# Patient Record
Sex: Female | Born: 2000 | Race: Black or African American | Hispanic: No | Marital: Single | Smoking: Never smoker
Health system: Southern US, Community
[De-identification: ages and names within clinical notes are randomized; demographics above are authoritative.]

## PROBLEM LIST (undated history)

## (undated) DIAGNOSIS — J45909 Unspecified asthma, uncomplicated: Secondary | ICD-10-CM

---
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
## 2022-06-13 ENCOUNTER — Ambulatory Visit
Admission: RE | Admit: 2022-06-13 | Discharge: 2022-06-13 | Disposition: A | Discharge status: Home / Self Care | Payer: Medicaid Other | Source: Ambulatory Visit | Attending: Pediatrics | Admitting: Pediatrics

## 2022-06-13 VITALS — BP 98/65 | HR 100 | Temp 100.5°F | Resp 16

## 2022-06-13 DIAGNOSIS — U071 COVID-19: Secondary | ICD-10-CM | POA: Diagnosis not present

## 2022-06-13 NOTE — Discharge Instructions (Signed)
Your positive home COVID test is diagnostic for COVID-19.  Please isolate yourself from others for the next 3 days.  If you are feeling better after 3 days, please continue wearing a mask around other people for the next 5 days.  I have provided you with a note to return to work.

## 2022-06-13 NOTE — ED Provider Notes (Signed)
[facility]    CSN: [id number] Arrival date & time: 06/13/22  1548    HISTORY   Chief Complaint  Patient presents with   Headache    Positive at home covid test - Entered by patient   HPI Daisy Jackson is a pleasant, 21 y.o. female who presents to urgent care today. Patient reports testing positive for COVID-19 with a home test today.  Patient states she is been having headache and body ache.  Patient states she does not take any medications for her symptoms, reports otherwise feeling fairly well this time.  Patient does have a slightly elevated temperature on arrival.  Patient is specifically requesting a note for her employer.  The history is provided by the patient.   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 No pertinent surgical history. OB History   No obstetric history on file.    Home Medications    Prior to Admission medications   Not on File    Family History History reviewed. No pertinent family history. Social History Social History   Tobacco Use   Smoking status: Never  Substance Use Topics   Alcohol use: No   Drug use: Never   Allergies   Patient has no known allergies.  Review of Systems Review of Systems Pertinent findings revealed after performing a 14 point review of systems has been noted in the history of present illness.  Physical Exam Triage Vital Signs ED Triage Vitals  Enc Vitals Group     BP 07/28/21 0827 (!) 147/82     Pulse Rate 07/28/21 0827 72     Resp 07/28/21 0827 18     Temp 07/28/21 0827 98.3 F (36.8 C)     Temp Source 07/28/21 0827 Oral     SpO2 07/28/21 0827 98 %     Weight --      Height --      Head Circumference --      Peak Flow --      Pain Score 07/28/21 0826 5     Pain Loc --      Pain Edu? --      Excl. in GC? --   No data found.  Updated Vital Signs BP 98/65 (BP Location: Left  Arm)   Pulse 100   Temp (!) 100.5 F (38.1 C) (Oral)   Resp 16   LMP 06/12/2022 (Approximate)   SpO2 98%   Physical Exam Vitals and nursing note reviewed.  Constitutional:      General: She is not in acute distress.    Appearance: Normal appearance.  HENT:     Head: Normocephalic and atraumatic.  Eyes:     Pupils: Pupils are equal, round, and reactive to light.  Cardiovascular:     Rate and Rhythm: Normal rate and regular rhythm.  Pulmonary:     Effort: Pulmonary effort is normal.     Breath sounds: Normal breath sounds.  Musculoskeletal:        General: Normal range of motion.     Cervical back: Normal range of motion and neck supple.  Skin:    General: Skin is warm and dry.  Neurological:     General: No focal deficit present.     Mental Status: She is alert and oriented to person, place, and time. Mental status is at baseline.  Psychiatric:  Mood and Affect: Mood normal.        Behavior: Behavior normal.        Thought Content: Thought content normal.        Judgment: Judgment normal.     Visual Acuity Right Eye Distance:   Left Eye Distance:   Bilateral Distance:    Right Eye Near:   Left Eye Near:    Bilateral Near:     UC Couse / Diagnostics / Procedures:     Radiology No results found.  Procedures Procedures (including critical care time) EKG  Pending results:  Labs Reviewed - No data to display  Medications Ordered in UC: Medications - No data to display  UC Diagnoses / Final Clinical Impressions(s)   I have reviewed the triage vital signs and the nursing notes.  Pertinent labs & imaging results that were available during my care of the patient were reviewed by me and considered in my medical decision making (see chart for details).    Final diagnoses:  COVID-19   Provided with a return to work note at her request.  Isolation precautions advised.  ED Prescriptions   None    PDMP not reviewed this encounter.  Pending results:   Labs Reviewed - No data to display  Discharge Instructions:   Discharge Instructions      Your positive home COVID test is diagnostic for COVID-19.  Please isolate yourself from others for the next 3 days.  If you are feeling better after 3 days, please continue wearing a mask around other people for the next 5 days.  I have provided you with a note to return to work.     Disposition Upon Discharge:  Condition: stable for discharge home  Patient presented with an acute illness with associated systemic symptoms and significant discomfort requiring urgent management. In my opinion, this is a condition that a prudent lay person (someone who possesses an average knowledge of health and medicine) may potentially expect to result in complications if not addressed urgently such as respiratory distress, impairment of bodily function or dysfunction of bodily organs.   Routine symptom specific, illness specific and/or disease specific instructions were discussed with the patient and/or caregiver at length.   As such, the patient has been evaluated and assessed, work-up was performed and treatment was provided in alignment with urgent care protocols and evidence based medicine.  Patient/parent/caregiver has been advised that the patient may require follow up for further testing and treatment if the symptoms continue in spite of treatment, as clinically indicated and appropriate.  Patient/parent/caregiver has been advised to return to the [facility] or PCP if no better; to PCP or the Emergency Department if new signs and symptoms develop, or if the current signs or symptoms continue to change or worsen for further workup, evaluation and treatment as clinically indicated and appropriate  The patient will follow up with their current PCP if and as advised. If the patient does not currently have a PCP we will assist them in obtaining one.   The patient may need specialty follow up if the symptoms continue, in  spite of conservative treatment and management, for further workup, evaluation, consultation and treatment as clinically indicated and appropriate.   Patient/parent/caregiver verbalized understanding and agreement of plan as discussed.  All questions were addressed during visit.  Please see discharge instructions below for further details of plan.  This office note has been dictated using Teaching laboratory technician.  Unfortunately, this method of dictation can sometimes  lead to typographical or grammatical errors.  I apologize for your inconvenience in advance if this occurs.  Please do not hesitate to reach out to me if clarification is needed.      Theadora Rama Scales, [place] 06/13/22 [phone]

## 2022-06-13 NOTE — ED Triage Notes (Signed)
The patient states she tested positive for covid today at home.  symptoms: migraine, body aches  Started: today   Home interventions: none

## 2023-01-30 ENCOUNTER — Ambulatory Visit
Admission: RE | Admit: 2023-01-30 | Discharge: 2023-01-30 | Disposition: A | Discharge status: Home / Self Care | Payer: Medicaid Other | Source: Ambulatory Visit

## 2023-01-30 VITALS — BP 109/76 | HR 83 | Temp 99.7°F | Resp 14

## 2023-01-30 DIAGNOSIS — R519 Headache, unspecified: Secondary | ICD-10-CM | POA: Diagnosis not present

## 2023-01-30 MED ORDER — EXCEDRIN MIGRAINE 250-250-65 MG PO TABS: 1.0000 | ORAL_TABLET | Freq: Four times a day (QID) | ORAL | 0 refills | Status: AC | PRN

## 2023-01-30 MED ORDER — NAPROXEN 375 MG PO TABS: 375.0000 mg | ORAL_TABLET | Freq: Two times a day (BID) | ORAL | 0 refills | Status: AC

## 2023-01-30 NOTE — ED Triage Notes (Signed)
Pt reports headache x 1 day. Tylenol gives relief

## 2023-01-30 NOTE — ED Provider Notes (Signed)
[facility]  Note:  This document was prepared using Conservation officer, historic buildings and may include unintentional dictation errors.  MRN: [id number] DOB: 06-21-2001  Subjective:   Daisy Jackson is a 22 y.o. female presenting for 1 day history of a persistent frontal headache with photophobia.  Symptoms were the worst yesterday and have improved today but she had to miss school on both days.  No fever, neck pain, vision changes, neck stiffness, runny or stuffy nose, sore throat, cough.  Has a history of headaches like this and has responded well to her typical headache medication but does not have any currently.  Has used naproxen successfully in the past.  No triptan medications.  Has not had to be evaluated by neurology.  No current facility-administered medications for this encounter.  Current Outpatient Medications:    acetaminophen (TYLENOL) 500 MG tablet, Take 500 mg by mouth every 6 (six) hours as needed., Disp: , Rfl:    albuterol (VENTOLIN HFA) 108 (90 Base) MCG/ACT inhaler, Inhale into the lungs., Disp: , Rfl:    Levonorgestrel (SKYLA) 13.5 MG IUD, by Intrauterine route., Disp: , Rfl:    No Known Allergies  Past Medical History:  Diagnosis Date   Asthma      History reviewed. No pertinent surgical history.  History reviewed. No pertinent family history.  Social History   Tobacco Use   Smoking status: Never   Smokeless tobacco: Never  Vaping Use   Vaping Use: Never used  Substance Use Topics   Alcohol use: No   Drug use: Never    ROS   Objective:   Vitals: BP 109/76 (BP Location: Right Arm)   Pulse 83   Temp 99.7 F (37.6 C) (Oral)   Resp 14   LMP  (Within Months) Comment: 1 month  SpO2 99%   Physical Exam Constitutional:      General: She is not in acute distress.    Appearance: Normal appearance. She is well-developed and normal weight. She is not ill-appearing, toxic-appearing or diaphoretic.  HENT:     Head:  Normocephalic and atraumatic.     Right Ear: Tympanic membrane, ear canal and external ear normal. No drainage or tenderness. No middle ear effusion. There is no impacted cerumen. Tympanic membrane is not erythematous or bulging.     Left Ear: Tympanic membrane, ear canal and external ear normal. No drainage or tenderness.  No middle ear effusion. There is no impacted cerumen. Tympanic membrane is not erythematous or bulging.     Nose: Nose normal. No congestion or rhinorrhea.     Mouth/Throat:     Mouth: Mucous membranes are moist. No oral lesions.     Pharynx: No pharyngeal swelling, oropharyngeal exudate, posterior oropharyngeal erythema or uvula swelling.     Tonsils: No tonsillar exudate or tonsillar abscesses.  Eyes:     General: Lids are normal. Lids are everted, no foreign bodies appreciated. Vision grossly intact. No scleral icterus.       Right eye: No foreign body, discharge or hordeolum.        Left eye: No foreign body, discharge or hordeolum.     Extraocular Movements: Extraocular movements intact.     Right eye: Normal extraocular motion.     Left eye: Normal extraocular motion and no nystagmus.     Conjunctiva/sclera: Conjunctivae normal.     Right eye: Right conjunctiva is not injected. No chemosis, exudate or hemorrhage.    Left eye: Left conjunctiva is  not injected. No chemosis, exudate or hemorrhage. Neck:     Meningeal: Brudzinski's sign and Kernig's sign absent.  Cardiovascular:     Rate and Rhythm: Normal rate.  Pulmonary:     Effort: Pulmonary effort is normal.  Musculoskeletal:     Cervical back: Normal range of motion and neck supple.  Lymphadenopathy:     Cervical: No cervical adenopathy.  Skin:    General: Skin is warm and dry.  Neurological:     General: No focal deficit present.     Mental Status: She is alert and oriented to person, place, and time.     Cranial Nerves: No cranial nerve deficit, dysarthria or facial asymmetry.     Motor: No weakness or  pronator drift.     Coordination: Romberg sign negative. Coordination normal. Finger-Nose-Finger Test and Heel to [facility] Test normal. Rapid alternating movements normal.     Gait: Gait and tandem walk normal.     Deep Tendon Reflexes: Reflexes normal.  Psychiatric:        Mood and Affect: Mood normal.        Behavior: Behavior normal.        Thought Content: Thought content normal.        Judgment: Judgment normal.     Assessment and Plan :   PDMP not reviewed this encounter.  1. Generalized headaches    Patient has a reassuring exam.  Recommended naproxen and/or Excedrin for general headache management.  Provided her with a note for school.  No signs of an acute encephalopathy.  Counseled patient on potential for adverse effects with medications prescribed/recommended today, ER and return-to-clinic precautions discussed, patient verbalized understanding.    Wallis Bamberg, [place] 01/30/23 [phone]

## 2023-03-19 ENCOUNTER — Ambulatory Visit
Admission: RE | Admit: 2023-03-19 | Discharge: 2023-03-19 | Disposition: A | Discharge status: Home / Self Care | Payer: Managed Care, Other (non HMO) | Source: Ambulatory Visit | Attending: Pediatrics | Admitting: Pediatrics

## 2023-03-19 VITALS — BP 108/66 | HR 93 | Temp 99.2°F | Resp 16

## 2023-03-19 DIAGNOSIS — S6992XA Unspecified injury of left wrist, hand and finger(s), initial encounter: Secondary | ICD-10-CM | POA: Diagnosis not present

## 2023-03-19 NOTE — ED Triage Notes (Signed)
Nail bed on left ring finger has a green discoloration after wearing press-on nails for around 4 days. Has been using tea tree oil and apple cider vinegar soaks without improvement. No redness, warmth, or swelling to the effected finger, no visible drainage.

## 2023-03-19 NOTE — Discharge Instructions (Signed)
Follow up here or with your primary care provider if your symptoms are worsening or not improving.     
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
## 2023-04-28 ENCOUNTER — Inpatient Hospital Stay: Admission: RE | Admit: 2023-04-28 | Payer: Self-pay | Source: Ambulatory Visit

## 2023-09-19 ENCOUNTER — Ambulatory Visit: Payer: Self-pay

## 2023-12-22 ENCOUNTER — Ambulatory Visit
Admission: RE | Admit: 2023-12-22 | Discharge: 2023-12-22 | Disposition: A | Discharge status: Home / Self Care | Payer: Self-pay | Source: Ambulatory Visit

## 2023-12-22 VITALS — BP 100/63 | HR 65 | Temp 98.0°F | Resp 18

## 2023-12-22 DIAGNOSIS — M25511 Pain in right shoulder: Secondary | ICD-10-CM

## 2023-12-22 MED ORDER — PREDNISONE 10 MG (21) PO TBPK
ORAL_TABLET | Freq: Every day | ORAL | 0 refills | Status: AC
Start: 2023-12-22 — End: ?

## 2023-12-22 NOTE — ED Provider Notes (Signed)
 [name]    CSN: [id number] Arrival date & time: 12/22/23  1326      History   Chief Complaint Chief Complaint  Patient presents with   Shoulder Pain    Having issues with my shoulder bursitis. - Entered by patient    HPI Daisy Jackson is a 23 y.o. female.  Patient presents with right shoulder pain x 2-3 weeks.  No falls or injury.  No numbness or weakness in her hand or arm.  No wounds, bruising, redness.  She has had problems with this shoulder intermittently for a couple of years.  She was seen at [facility] and told that she had bursitis which was treated with prednisone; this was more than a year ago.  The history is provided by the patient and medical records.    Past Medical History:  Diagnosis Date   Asthma     Patient Active Problem List   Diagnosis Date Noted   ERB'S PALSY 08/23/2008    History reviewed. No pertinent surgical history.  OB History   No obstetric history on file.      Home Medications    Prior to Admission medications   Medication Sig Start Date End Date Taking? Authorizing Provider  predniSONE (STERAPRED UNI-PAK 21 TAB) 10 MG (21) TBPK tablet Take by mouth daily. As directed 12/22/23  Yes Mickie Bail, NP  acetaminophen (TYLENOL) 500 MG tablet Take 500 mg by mouth every 6 (six) hours as needed. Patient not taking: Reported on 12/22/2023    [provider]  albuterol (VENTOLIN HFA) 108 (90 Base) MCG/ACT inhaler Inhale into the lungs. 07/11/17   [provider]  aspirin-acetaminophen-caffeine (EXCEDRIN MIGRAINE) [phone] MG tablet Take 1 tablet by mouth every 6 (six) hours as needed for headache or migraine. Patient not taking: Reported on 12/22/2023 01/30/23   Wallis Bamberg, PA-C  escitalopram (LEXAPRO) 20 MG tablet Take 20 mg by mouth daily.    [provider]  Levonorgestrel (SKYLA) 13.5 MG IUD by Intrauterine route.    [provider]  mirtazapine (REMERON) 15 MG tablet Take 15 mg by mouth  at bedtime.    [provider]  naproxen (NAPROSYN) 375 MG tablet Take 1 tablet (375 mg total) by mouth 2 (two) times daily with a meal. 01/30/23   Wallis Bamberg, PA-C    Family History History reviewed. No pertinent family history.  Social History Social History   Tobacco Use   Smoking status: Never   Smokeless tobacco: Never  Vaping Use   Vaping status: Never Used  Substance Use Topics   Alcohol use: No   Drug use: Never     Allergies   Patient has no known allergies.   Review of Systems Review of Systems  Constitutional:  Negative for chills and fever.  Musculoskeletal:  Positive for arthralgias. Negative for joint swelling.  Skin:  Negative for color change, rash and wound.  Neurological:  Negative for weakness and numbness.     Physical Exam Triage Vital Signs ED Triage Vitals  Encounter Vitals Group     BP 12/22/23 1335 100/63     Systolic BP Percentile --      Diastolic BP Percentile --      Pulse Rate 12/22/23 1335 65     Resp 12/22/23 1335 18     Temp 12/22/23 1335 98 F (36.7 C)     Temp src --      SpO2 12/22/23 1335 97 %     Weight --  Height --      Head Circumference --      Peak Flow --      Pain Score 12/22/23 1340 2     Pain Loc --      Pain Education --      Exclude from Growth Chart --    No data found.  Updated Vital Signs BP 100/63   Pulse 65   Temp 98 F (36.7 C)   Resp 18   SpO2 97%   Visual Acuity Right Eye Distance:   Left Eye Distance:   Bilateral Distance:    Right Eye Near:   Left Eye Near:    Bilateral Near:     Physical Exam Constitutional:      General: She is not in acute distress. HENT:     Mouth/Throat:     Mouth: Mucous membranes are moist.  Cardiovascular:     Rate and Rhythm: Normal rate and regular rhythm.  Pulmonary:     Effort: Pulmonary effort is normal. No respiratory distress.  Musculoskeletal:        General: Tenderness present. No swelling or deformity.     Comments: Limited  range of motion and right shoulder due to discomfort.  Mild generalized tenderness.  Skin:    General: Skin is warm and dry.     Capillary Refill: Capillary refill takes less than 2 seconds.     Findings: No bruising, erythema, lesion or rash.  Neurological:     General: No focal deficit present.     Mental Status: She is alert.     Sensory: No sensory deficit.     Motor: No weakness.      UC Treatments / Results  Labs (all labs ordered are listed, but only abnormal results are displayed) Labs Reviewed - No data to display  EKG   Radiology No results found.  Procedures Procedures (including critical care time)  Medications Ordered in UC Medications - No data to display  Initial Impression / Assessment and Plan / UC Course  I have reviewed the triage vital signs and the nursing notes.  Pertinent labs & imaging results that were available during my care of the patient were reviewed by me and considered in my medical decision making (see chart for details).    Right shoulder pain.  No trauma.  Treating with prednisone taper.  Education provided on shoulder pain.  Instructed patient to follow-up with her orthopedist at [facility].  She agrees to plan of care.  Final Clinical Impressions(s) / UC Diagnoses   Final diagnoses:  Right shoulder pain, unspecified chronicity     Discharge Instructions      Take the prednisone as directed.  Follow up with an orthopedist.       ED Prescriptions     Medication Sig Dispense Auth. Provider   predniSONE (STERAPRED UNI-PAK 21 TAB) 10 MG (21) TBPK tablet Take by mouth daily. As directed 21 tablet Mickie Bail, NP      PDMP not reviewed this encounter.   Mickie Bail, NP 12/22/23 1425

## 2023-12-22 NOTE — ED Triage Notes (Signed)
 Patient to Urgent Care with complaints of recurrent shoulder pain. States she has been previously diagnosed with shoulder bursitis. Has been stretches/ exercising.  Works in a [employer]. States at times she feels like her shoulder "pops" and she will feel like her should gets "stuck".   Symptoms started 1.5-2 years ago. Pain is intermittent. States this has always been on and off but this is the longest it has been "on"- approx 2-3 weeks.

## 2023-12-22 NOTE — Discharge Instructions (Addendum)
 Take the prednisone as directed.  Follow up with an orthopedist.
